# Patient Record
Sex: Male | Born: 1995 | Race: Black or African American | Hispanic: No | Marital: Single | Smoking: Current some day smoker
Health system: Southern US, Community
[De-identification: ages and names within clinical notes are randomized; demographics above are authoritative.]

## PROBLEM LIST (undated history)

## (undated) DIAGNOSIS — R569 Unspecified convulsions: Secondary | ICD-10-CM

---

## 2019-05-12 ENCOUNTER — Encounter (HOSPITAL_COMMUNITY): Payer: Self-pay | Admitting: Emergency Medicine

## 2019-05-12 ENCOUNTER — Emergency Department (HOSPITAL_COMMUNITY)
Admission: EM | Admit: 2019-05-12 | Discharge: 2019-05-12 | Disposition: A | Discharge status: Home / Self Care | Payer: Self-pay | Attending: Emergency Medicine | Admitting: Emergency Medicine

## 2019-05-12 ENCOUNTER — Other Ambulatory Visit: Payer: Self-pay

## 2019-05-12 DIAGNOSIS — F17200 Nicotine dependence, unspecified, uncomplicated: Secondary | ICD-10-CM | POA: Insufficient documentation

## 2019-05-12 DIAGNOSIS — R569 Unspecified convulsions: Secondary | ICD-10-CM | POA: Insufficient documentation

## 2019-05-12 HISTORY — DX: Unspecified convulsions: R56.9

## 2019-05-12 MED ORDER — LEVETIRACETAM 500 MG PO TABS
1000.0000 mg | ORAL_TABLET | Freq: Once | ORAL | Status: AC
  Administered 2019-05-12: 1000 mg via ORAL
  Filled 2019-05-12: qty 2

## 2019-05-12 MED ORDER — LEVETIRACETAM 750 MG PO TABS: 750.0000 mg | ORAL_TABLET | Freq: Two times a day (BID) | ORAL | 0 refills | Status: DC

## 2019-05-12 NOTE — ED Triage Notes (Signed)
Patient is form home where he had a seizure this morning causing him to fall out of his bed to the floor. He was post ictal with EMS but is now alert and oriented. He has a history of seizures and has not been compliant with medications.   EMS vitals: 156/102 BP  92 HR 94% O2 sat on room air 137 CBG

## 2019-05-12 NOTE — ED Provider Notes (Signed)
[facility]-EMERGENCY DEPT Provider Note   CSN: [id number] Arrival date & time: 05/12/19  1142     History   Chief Complaint Chief Complaint  Patient presents with  . Seizures    HPI David Kelley is a 23 y.o. male.     HPI Patient presents after possible seizure. Patient does not recall awakening this morning, until he did so in an ambulance. He states that he was in his usual state of health when he went to sleep last night. He currently denies any pain, weakness, discomfort. Per report the patient had a witnessed seizure at home. He is reportedly also not compliant with his medication. Patient self acknowledges taking his last dose of Keppra about 1 week ago, cannot specify why he has not taken his medication more recently. Per EMS the patient was postictal, classically on their arrival, but improved in route to the hospital.  Past Medical History:  Diagnosis Date  . Seizures (HCC)     There are no active problems to display for this patient.   History reviewed. No pertinent surgical history.      Home Medications    Prior to Admission medications   Not on File    Family History History reviewed. No pertinent family history.  Social History Social History   Tobacco Use  . Smoking status: Current Some Day Smoker  . Smokeless tobacco: Never Used  Substance Use Topics  . Alcohol use: Not on file  . Drug use: Not on file     Allergies   Patient has no known allergies.   Review of Systems Review of Systems  Constitutional:       Per HPI, otherwise negative  HENT:       Per HPI, otherwise negative  Respiratory:       Per HPI, otherwise negative  Cardiovascular:       Per HPI, otherwise negative  Gastrointestinal: Negative for vomiting.  Endocrine:       Negative aside from HPI  Genitourinary:       Neg aside from HPI   Musculoskeletal:       Per HPI, otherwise negative  Skin: Negative.   Neurological: Positive  for seizures. Negative for syncope.     Physical Exam Updated Vital Signs BP (!) 150/98   Pulse 80   Temp 97.6 F (36.4 C) (Oral)   Resp 15   Ht 6\' 4"  (1.93 m)   Wt (!) 147.4 kg   SpO2 96%   BMI 39.56 kg/m   Physical Exam Vitals signs and nursing note reviewed.  Constitutional:      General: He is not in acute distress.    Appearance: He is well-developed. He is obese.     Comments: Obese adult young male sleepy, but oriented appropriately when awake, speaking clearly, denying complaints.  HENT:     Head: Normocephalic and atraumatic.     Comments: Minor trauma, posterior right tongue, no active bleeding Eyes:     Conjunctiva/sclera: Conjunctivae normal.  Neck:     Musculoskeletal: No spinous process tenderness or muscular tenderness.  Cardiovascular:     Rate and Rhythm: Normal rate and regular rhythm.  Pulmonary:     Effort: Pulmonary effort is normal. No respiratory distress.     Breath sounds: No stridor.  Abdominal:     General: There is no distension.  Skin:    General: Skin is warm and dry.  Neurological:     General: No focal deficit present.  Mental Status: He is oriented to person, place, and time.     Motor: Motor function is intact.      ED Treatments / Results   Procedures Procedures (including critical care time)  Medications Ordered in ED Medications  levETIRAcetam (KEPPRA) tablet 1,000 mg (1,000 mg Oral Given 05/12/19 1217)     Initial Impression / Assessment and Plan / ED Course  I have reviewed the triage vital signs and the nursing notes.  Pertinent labs & imaging results that were available during my care of the patient were reviewed by me and considered in my medical decision making (see chart for details).    After arrival the patient was provided Keppra.     1:39 PM Now accompanied by girlfriend, in no distress, much more awake. This obese young male with history of seizures, acknowledged medical noncompliance presents  after a likely seizure. Here he is awake and alert after a postictal phase. He has no evidence for persistent neurologic deficits, no complaints, no evidence for traumatic findings, with his history of seizures, medication noncompliance he was started on Keppra.  Final Clinical Impressions(s) / ED Diagnoses   Seizure   Carmin Muskrat, MD 05/12/19 1339

## 2020-01-04 ENCOUNTER — Encounter (HOSPITAL_COMMUNITY): Payer: Self-pay | Admitting: Emergency Medicine

## 2020-01-04 ENCOUNTER — Emergency Department (HOSPITAL_COMMUNITY): Payer: BC Managed Care – PPO

## 2020-01-04 ENCOUNTER — Observation Stay (HOSPITAL_COMMUNITY)
Admission: EM | Admit: 2020-01-04 | Discharge: 2020-01-05 | Disposition: A | Discharge status: Home / Self Care | Payer: BC Managed Care – PPO | Attending: Family Medicine | Admitting: Family Medicine

## 2020-01-04 ENCOUNTER — Other Ambulatory Visit: Payer: Self-pay

## 2020-01-04 ENCOUNTER — Observation Stay (HOSPITAL_COMMUNITY): Payer: BC Managed Care – PPO

## 2020-01-04 DIAGNOSIS — G40909 Epilepsy, unspecified, not intractable, without status epilepticus: Principal | ICD-10-CM | POA: Insufficient documentation

## 2020-01-04 DIAGNOSIS — M25511 Pain in right shoulder: Secondary | ICD-10-CM | POA: Diagnosis not present

## 2020-01-04 DIAGNOSIS — F17200 Nicotine dependence, unspecified, uncomplicated: Secondary | ICD-10-CM | POA: Insufficient documentation

## 2020-01-04 DIAGNOSIS — I10 Essential (primary) hypertension: Secondary | ICD-10-CM | POA: Diagnosis not present

## 2020-01-04 DIAGNOSIS — G8929 Other chronic pain: Secondary | ICD-10-CM | POA: Diagnosis not present

## 2020-01-04 DIAGNOSIS — Z79899 Other long term (current) drug therapy: Secondary | ICD-10-CM | POA: Insufficient documentation

## 2020-01-04 DIAGNOSIS — E6609 Other obesity due to excess calories: Secondary | ICD-10-CM | POA: Insufficient documentation

## 2020-01-04 DIAGNOSIS — E7849 Other hyperlipidemia: Secondary | ICD-10-CM

## 2020-01-04 DIAGNOSIS — M25519 Pain in unspecified shoulder: Secondary | ICD-10-CM

## 2020-01-04 DIAGNOSIS — R569 Unspecified convulsions: Secondary | ICD-10-CM

## 2020-01-04 DIAGNOSIS — M25512 Pain in left shoulder: Secondary | ICD-10-CM | POA: Diagnosis not present

## 2020-01-04 DIAGNOSIS — Z20822 Contact with and (suspected) exposure to covid-19: Secondary | ICD-10-CM | POA: Diagnosis not present

## 2020-01-04 DIAGNOSIS — Z9119 Patient's noncompliance with other medical treatment and regimen: Secondary | ICD-10-CM

## 2020-01-04 DIAGNOSIS — Z91199 Patient's noncompliance with other medical treatment and regimen due to unspecified reason: Secondary | ICD-10-CM

## 2020-01-04 LAB — RAPID URINE DRUG SCREEN, HOSP PERFORMED
Amphetamines: NOT DETECTED
Barbiturates: NOT DETECTED
Benzodiazepines: NOT DETECTED
Cocaine: NOT DETECTED
Opiates: NOT DETECTED
Tetrahydrocannabinol: POSITIVE — AB

## 2020-01-04 LAB — URINALYSIS, ROUTINE W REFLEX MICROSCOPIC
Bilirubin Urine: NEGATIVE
Glucose, UA: NEGATIVE mg/dL
Hgb urine dipstick: NEGATIVE
Ketones, ur: NEGATIVE mg/dL
Leukocytes,Ua: NEGATIVE
Nitrite: NEGATIVE
Protein, ur: NEGATIVE mg/dL
Specific Gravity, Urine: 1.02 (ref 1.005–1.030)
pH: 5 (ref 5.0–8.0)

## 2020-01-04 LAB — COMPREHENSIVE METABOLIC PANEL
ALT: 23 U/L (ref 0–44)
AST: 19 U/L (ref 15–41)
Albumin: 3.7 g/dL (ref 3.5–5.0)
Alkaline Phosphatase: 62 U/L (ref 38–126)
Anion gap: 12 (ref 5–15)
BUN: 10 mg/dL (ref 6–20)
CO2: 23 mmol/L (ref 22–32)
Calcium: 9.3 mg/dL (ref 8.9–10.3)
Chloride: 106 mmol/L (ref 98–111)
Creatinine, Ser: 0.9 mg/dL (ref 0.61–1.24)
GFR calc Af Amer: >60 mL/min (ref >60)
GFR calc non Af Amer: >60 mL/min (ref >60)
Glucose, Bld: 98 mg/dL (ref 70–99)
Potassium: 4.2 mmol/L (ref 3.5–5.1)
Sodium: 141 mmol/L (ref 135–145)
Total Bilirubin: 0.5 mg/dL (ref 0.3–1.2)
Total Protein: 6.6 g/dL (ref 6.5–8.1)

## 2020-01-04 LAB — CBC WITH DIFFERENTIAL/PLATELET
Abs Immature Granulocytes: 0.08 10*3/uL — ABNORMAL HIGH (ref 0.00–0.07)
Basophils Absolute: 0 10*3/uL (ref 0.0–0.1)
Basophils Relative: 0 %
Eosinophils Absolute: 0.1 10*3/uL (ref 0.0–0.5)
Eosinophils Relative: 1 %
HCT: 43.6 % (ref 39.0–52.0)
Hemoglobin: 13.3 g/dL (ref 13.0–17.0)
Immature Granulocytes: 1 %
Lymphocytes Relative: 13 %
Lymphs Abs: 2.1 10*3/uL (ref 0.7–4.0)
MCH: 27.1 pg (ref 26.0–34.0)
MCHC: 30.5 g/dL (ref 30.0–36.0)
MCV: 89 fL (ref 80.0–100.0)
Monocytes Absolute: 1.4 10*3/uL — ABNORMAL HIGH (ref 0.1–1.0)
Monocytes Relative: 9 %
Neutro Abs: 12.2 10*3/uL — ABNORMAL HIGH (ref 1.7–7.7)
Neutrophils Relative %: 76 %
Platelets: 168 10*3/uL (ref 150–400)
RBC: 4.9 MIL/uL (ref 4.22–5.81)
RDW: 14.8 % (ref 11.5–15.5)
WBC: 15.8 10*3/uL — ABNORMAL HIGH (ref 4.0–10.5)
nRBC: 0 % (ref 0.0–0.2)

## 2020-01-04 LAB — MAGNESIUM: Magnesium: 1.9 mg/dL (ref 1.7–2.4)

## 2020-01-04 LAB — CBG MONITORING, ED: Glucose-Capillary: 107 mg/dL — ABNORMAL HIGH (ref 70–99)

## 2020-01-04 LAB — CK: Total CK: 322 U/L (ref 49–397)

## 2020-01-04 LAB — SARS CORONAVIRUS 2 BY RT PCR (HOSPITAL ORDER, PERFORMED IN ~~LOC~~ HOSPITAL LAB): SARS Coronavirus 2: NEGATIVE

## 2020-01-04 MED ORDER — SODIUM CHLORIDE 0.9 % IV SOLN
2000.0000 mg | Freq: Once | INTRAVENOUS | Status: DC
  Filled 2020-01-04: qty 20

## 2020-01-04 MED ORDER — LORAZEPAM 2 MG/ML IJ SOLN: 2.0000 mg | Freq: Once | INTRAMUSCULAR | Status: DC | PRN

## 2020-01-04 MED ORDER — LEVETIRACETAM IN NACL 1500 MG/100ML IV SOLN
1500.0000 mg | Freq: Once | INTRAVENOUS | Status: DC
  Filled 2020-01-04: qty 100

## 2020-01-04 MED ORDER — ONDANSETRON HCL 4 MG/2ML IJ SOLN
4.0000 mg | Freq: Once | INTRAMUSCULAR | Status: AC
  Administered 2020-01-04: 4 mg via INTRAVENOUS
  Filled 2020-01-04: qty 2

## 2020-01-04 MED ORDER — LORAZEPAM 2 MG/ML IJ SOLN
INTRAMUSCULAR | Status: AC
  Filled 2020-01-04: qty 1

## 2020-01-04 MED ORDER — SODIUM CHLORIDE 0.9 % IV SOLN
3000.0000 mg | Freq: Once | INTRAVENOUS | Status: DC
  Filled 2020-01-04: qty 30

## 2020-01-04 MED ORDER — LEVETIRACETAM IN NACL 1500 MG/100ML IV SOLN
1500.0000 mg | Freq: Two times a day (BID) | INTRAVENOUS | Status: DC
  Administered 2020-01-05: 1500 mg via INTRAVENOUS
  Filled 2020-01-04 (×3): qty 100

## 2020-01-04 MED ORDER — LORAZEPAM 2 MG/ML IJ SOLN
2.0000 mg | Freq: Once | INTRAMUSCULAR | Status: AC
  Administered 2020-01-04: 2 mg via INTRAVENOUS

## 2020-01-04 MED ORDER — ACETAMINOPHEN 650 MG RE SUPP: 650.0000 mg | Freq: Four times a day (QID) | RECTAL | Status: DC | PRN

## 2020-01-04 MED ORDER — LEVETIRACETAM IN NACL 1000 MG/100ML IV SOLN
1000.0000 mg | Freq: Once | INTRAVENOUS | Status: AC
  Administered 2020-01-04: 1000 mg via INTRAVENOUS

## 2020-01-04 MED ORDER — ACETAMINOPHEN 325 MG PO TABS: 650.0000 mg | ORAL_TABLET | Freq: Four times a day (QID) | ORAL | Status: DC | PRN

## 2020-01-04 MED ORDER — LEVETIRACETAM IN NACL 1000 MG/100ML IV SOLN
1000.0000 mg | INTRAVENOUS | Status: AC
  Administered 2020-01-04: 1000 mg via INTRAVENOUS

## 2020-01-04 MED ORDER — ENOXAPARIN SODIUM 40 MG/0.4ML ~~LOC~~ SOLN
40.0000 mg | SUBCUTANEOUS | Status: DC
  Administered 2020-01-05: 40 mg via SUBCUTANEOUS
  Filled 2020-01-04: qty 0.4

## 2020-01-04 NOTE — ED Triage Notes (Addendum)
Pt states his fiance reported he had "9 seizures" consecutively between 11:30am-12pm while sleeping.   PT reports tongue pain. States he is taking his Keppra as prescribed.

## 2020-01-04 NOTE — ED Provider Notes (Signed)
[facility] EMERGENCY DEPARTMENT Provider Note   CSN: [id number] Arrival date & time: 01/04/20  1316     History Chief Complaint  Patient presents with  . Seizures    David Kelley is a 24 y.o. male.  The history is provided by the patient, a significant other and medical records. No language interpreter was used.   David Kelley is a 24 y.o. male who presents to the Emergency Department complaining of seizure.  He presents to the ED for evaluation following seizure at home.  Hx from s.o. he had multiple seizures at home while they were both laying in bed. Seizure started around 1130 this morning. The initial start seizure started with foaming at the mouth in his eyes rolled back in his head. She reports generalized shaking activity. The episode lasted for a few minutes and then there were a few seconds to minutes in between episodes. He never regained consciousness in between episodes. She states there around nine back to back episodes. This lasted for 30 to 45 minutes. He denies any recent illnesses. He does feel tired currently. He takes Keppra 1000 mg BID and is good about taking his medications as prescribed. He did take it last night and today. He does report that he doesn't eat regularly and may only eat one meal a day. Symptoms are severe in nature.         Past Medical History:  Diagnosis Date  . Seizures (HCC)     There are no problems to display for this patient.   History reviewed. No pertinent surgical history.     No family history on file.  Social History   Tobacco Use  . Smoking status: Current Some Day Smoker  . Smokeless tobacco: Never Used  Substance Use Topics  . Alcohol use: Not Currently  . Drug use: Not Currently    Home Medications Prior to Admission medications   Medication Sig Start Date End Date Taking? Authorizing Provider  levETIRAcetam (KEPPRA) 750 MG tablet Take 1 tablet (750 mg total) by mouth 2 (two) times  daily. 05/12/19   Gerhard Munch, MD    Allergies    Patient has no known allergies.  Review of Systems   Review of Systems  All other systems reviewed and are negative.   Physical Exam Updated Vital Signs BP 131/89 (BP Location: Right Arm)   Pulse 94   Temp 98.1 F (36.7 C) (Oral)   Resp 14   SpO2 92%   Physical Exam Vitals and nursing note reviewed.  Constitutional:      Appearance: He is well-developed.  HENT:     Head: Normocephalic and atraumatic.     Comments: Mild right tongue swelling without any laceration Eyes:     Extraocular Movements: Extraocular movements intact.     Pupils: Pupils are equal, round, and reactive to light.  Cardiovascular:     Rate and Rhythm: Normal rate and regular rhythm.  Pulmonary:     Effort: Pulmonary effort is normal. No respiratory distress.  Abdominal:     Palpations: Abdomen is soft.     Tenderness: There is no abdominal tenderness. There is no guarding or rebound.  Musculoskeletal:        General: No tenderness.  Skin:    General: Skin is warm and dry.  Neurological:     Mental Status: He is alert and oriented to person, place, and time.     Comments: No asymmetry of facial movements. Visual fields are grossly  intact. Five out of five strength in all four extremities with sensation to light touch intact in all four extremities  Psychiatric:        Behavior: Behavior normal.     ED Results / Procedures / Treatments   Labs (all labs ordered are listed, but only abnormal results are displayed) Labs Reviewed  URINALYSIS, ROUTINE W REFLEX MICROSCOPIC - Abnormal; Notable for the following components:      Result Value   APPearance CLOUDY (*)    All other components within normal limits  CBC WITH DIFFERENTIAL/PLATELET - Abnormal; Notable for the following components:   WBC 15.8 (*)    Neutro Abs 12.2 (*)    Monocytes Absolute 1.4 (*)    Abs Immature Granulocytes 0.08 (*)    All other components within normal limits    CBG MONITORING, ED - Abnormal; Notable for the following components:   Glucose-Capillary 107 (*)    All other components within normal limits  COMPREHENSIVE METABOLIC PANEL  MAGNESIUM  CK    EKG None  Radiology DG Chest 2 View  Result Date: 01/04/2020 CLINICAL DATA:  Seizures while sleeping. EXAM: CHEST - 2 VIEW COMPARISON:  None. FINDINGS: The heart size and mediastinal contours are within normal limits. The lungs are clear. No pneumothorax or pleural effusion. The visualized skeletal structures are unremarkable. IMPRESSION: No acute cardiopulmonary process. Electronically Signed   By: Audie Pinto M.D.   On: 01/04/2020 15:18    Procedures Procedures (including critical care time)  Medications Ordered in ED Medications - No data to display  ED Course  I have reviewed the triage vital signs and the nursing notes.  Pertinent labs & imaging results that were available during my care of the patient were reviewed by me and considered in my medical decision making (see chart for details).    MDM Rules/Calculators/A&P                     Patient with history of seizure disorder here for evaluation following multiple seizures. He is neurologically intact on evaluation. Dr. Lorraine Lax with Neurology consulted given history of seizures that lasted 30 to 45 minutes. Patient care transferred pending full labs and neurology evaluation.  Final Clinical Impression(s) / ED Diagnoses Final diagnoses:  Seizure [facility])    Rx / [place] Orders ED Discharge Orders    None       Quintella Reichert, MD 01/04/20 1547

## 2020-01-04 NOTE — ED Notes (Signed)
Patient transported to X-ray 

## 2020-01-04 NOTE — Consult Note (Signed)
Requesting Physician: Dr. Madilyn Hook    Chief Complaint: Multiple seizures without return to baseline  History obtained from:  Chart review  HPI:                                                                                                                                       David Kelley is a 24 y.o. male with past medical history of obesity, generalized seizures presents to the emergency department after multiple seizures at home without return to baseline.  History obtained by ED provider is wife is not at bedside.  According to the wife patient was sleeping and around 11:30 AM she witnessed a generalized tonic-clonic seizure with tongue bite lasting for approximately 1 minute.  It appeared that he did not become responsive afterwards and then had multiple seizures approximately 9 or.  30 minutes.  When EMS arrived, patient started to arouse by the time he arrived to the [facility], ED patient was awake and responsive.  He states he took his Keppra this morning.  Takes 1 g twice daily and states he is compliant with the medication.  While in the emergency department, patient had another generalized tonic-clonic seizure lasting approximately 1 minute he received 2 mg of Ativan and neurology was consulted.  He was loaded with 2 g of Keppra.    Past Medical History:  Diagnosis Date   Seizures (HCC)     History reviewed. No pertinent surgical history.  No family history on file. Social History:  reports that he has been smoking. He has never used smokeless tobacco. He reports previous alcohol use. He reports previous drug use.  Allergies: No Known Allergies  Medications:                                                                                                                        I reviewed home medications.   ROS:  Unable to review systems due to  patient's mental status   Examination:                                                                                                      General: Appears well-developed and well-nourished.  Psych: Affect appropriate to situation Eyes: No scleral injection HENT: No OP obstrucion Head: Normocephalic.  Cardiovascular: Normal rate and regular rhythm.  Respiratory: Effort normal and breath sounds normal to anterior ascultation GI: Soft.  No distension. There is no tenderness.  Skin: WDI    Neurological Examination Mental Status: Alert, oriented, thought content appropriate.  Speech fluent without evidence of aphasia. Able to follow 3 step commands without difficulty. Cranial Nerves: II: Visual fields grossly normal,  III,IV, VI: ptosis not present, extra-ocular motions intact bilaterally, pupils equal, round, reactive to light and accommodation V,VII: smile symmetric, facial light touch sensation normal bilaterally VIII: hearing normal bilaterally IX,X: uvula rises symmetrically XI: bilateral shoulder shrug XII: midline tongue extension Motor: Right : Upper extremity   5/5    Left:     Upper extremity   5/5  Lower extremity   5/5     Lower extremity   5/5 Tone and bulk:normal tone throughout; no atrophy noted Sensory: Pinprick and light touch intact throughout, bilaterally Deep Tendon Reflexes: 2+ and symmetric throughout Plantars: Right: downgoing   Left: downgoing Cerebellar: normal finger-to-nose, normal rapid alternating movements and normal heel-to-shin test Gait: normal gait and station     Lab Results: Basic Metabolic Panel: Recent Labs  Lab 01/04/20 1528  NA 141  K 4.2  CL 106  CO2 23  GLUCOSE 98  BUN 10  CREATININE 0.90  CALCIUM 9.3  MG 1.9    CBC: Recent Labs  Lab 01/04/20 1528  WBC 15.8*  NEUTROABS 12.2*  HGB 13.3  HCT 43.6  MCV 89.0  PLT 168    Coagulation Studies: No results for input(s): LABPROT, INR in the last 72  hours.  Imaging: DG Chest 2 View  Result Date: 01/04/2020 CLINICAL DATA:  Seizures while sleeping. EXAM: CHEST - 2 VIEW COMPARISON:  None. FINDINGS: The heart size and mediastinal contours are within normal limits. The lungs are clear. No pneumothorax or pleural effusion. The visualized skeletal structures are unremarkable. IMPRESSION: No acute cardiopulmonary process. Electronically Signed   By: Emmaline Kluver M.D.   On: 01/04/2020 15:18     I have reviewed the above imaging : No imaging obtained   ASSESSMENT AND PLAN   Status epilepticus: Resolved Generalized tonic-clonic seizures  Recommendations Admit for observation Increase Keppra to 1500 mg twice daily  Check UA, UDS, electrolyte and metabolic disturbances Chart review for diagnosis-I do not see EEG and MRI brain or any neurology notes regarding his seizure diagnosis If patient has not had prior neurological evaluation for seizures-he will need MRI brain with and without contrast as well as an EEG. Ativan 2 mg for seizures lasting greater than 1 minute, please notify neurology on-call if patient has further seizures. Seizure precautions  Addendum - Patient not aware if he has seen  a neurologist. Current medication prescription appears to be from [facility] hospitalist. Needs MRI brain w/wo contrast and R EEG.    Sushanth Aroor [facility] Pager Number [phone]

## 2020-01-04 NOTE — H&P (Addendum)
Family Medicine Teaching [facility] Admission History and Physical Service Pager: [phone]  Patient name: David David Kelley Medical record number: [id number] Date of birth: October 24, 1995 Age: 24 y.o. Gender: male  Primary Care Provider: Patient, No Pcp Per Consultants: Neurology Code Status: Full Preferred Emergency Contact: Lupita Leash, partner  Chief Complaint: seizure  Assessment and Plan: David David Kelley is a 24 y.o. male presenting with seizure. PMH is significant for seizure disorder, chronic right shoulder pain, HTN.  Seizure 4 David [name] w/ h/o seizure d/o prescribed Keppra presents today to ED with report of seizure. His fiance reports he had 4 seizures over a 45 minute timeframe this morning in which he 'tensed up' and lost consciousness. Patient's partner reports that he had stomach pain this morning and had nausea with emesis x1 after his seizures started. . No food suspicious for food poisoning, as partner is not having any sx. She reports that patient was compliant with his medication and he told her he last took Keppra last night. He also smokes marijuana and used it once this morning prior to seizure. No known sick contacts. When patient arrived at ED, he appeared at his neurologic baseline. Subsequently patient had a witnessed generalized tonic-clonic seizure while in the ED and received 2mg  IV ativan and was loaded with 2g keppra. During this seizure, patient desatted to 86% and was placed on 3L , now satting >90% on RA. Patient's home dose of keppra is 1000 mg BID. Patient was hospitalized in February of this at [facility] year for seizures. He has 5 ED visits documented in epic since 2020 for seizures, with most of these visits documenting noncompliance of his keppra. He has had CXR without abnormalities. Labs reveal completely normal CMP, CK WNL at 322, WBCs mildly elevated to 15.8 and ANC elevated to 12.2 c/w demargination s/p seizure. UA WNL. On exam, patient is very somnolent,  but will respond to commands if stimulated. Somnolent state likely due to both post-ictal state and 2mg  IV ativan. Neurology consulted in the ED, appreciate their recommendations. Current ddx is likely underdosing of AED 2/2 body habitus, possibly seizure threshold lowered d/t gastroenteritis. pharmacy tech reports that patient's Rx bottle was last filled in February for 30 day supply, so likely noncompliance is also at play. In care everywhere, patient last had an EEG in 07/2018 that did not reveal epileptiform activity at that time. Last CT head in 10/03/19 that was unremarkable at that time. Does not appear that patient has had MRI, will obtain one. - Admit to med-surg, FPTS, attending Dr. 08/2018 - Continuous cardiac and pulse oximetry - O2 therapy to maintain SpO2 >90% - Neurology consulted, appreciate recommendations - NPO pending bedside swallow, afterwards HH diet - If more seizure events, can use 2mg  ativan IV for cessation - F/U MRI - UDS - Increase Keppra to 1500 mg BID  - Lovenox for DVT ppx - Seizure precautions - PT/OT eval and treat - AM BMP, CBC  HTN  BP in the ED has ranged from SBP 129-157, DBP 89-94. Most recently 157/93. Patient takes 20 mg lisinopril daily. -Continue home medication when no longer NPO  Chronic R shoulder pain Patient reports his shoulder hurts after seizures. He denies sports injuries. At last admission in February, patient was supposed to follow up with orthopedics. -Tylenol PRN for pain when no longer NPO  FEN/GI: Regular diet Prophylaxis: lovenox  Disposition: to med-surg pending medical work up  History of Present Illness:  David David Kelley is a 24 y.o. male presenting  with seizures. Patient was witnessed to have seizures this morning by his partner, Lynett Grimes. She described them as 4 episodes over a 45 minute timeframe in which his body tensed up, he lost consciousness, and was foaming at the mouth.  She states his mouth was bloody from biting  his tongue. she also reports that he was quite sleepy appearing this morning, and he also had nausea, vomiting, and stomach pain.  Patient and his partner ate [employer] this morning, but partner is not sick so unlikely to be food poisoning.  He also had marijuana this AM, but less so than he usually does, "only 1 or 2 hits."  His partner is unaware of any sick contacts.  She denies any recent fevers, chills, coughs. He does not smoke cigarettes or use other recreational drugs. He last took his Keppra yesterday evening, and partner reports that he is compliant with his medications. He was diagnosed with seizure disorder at the end of 2020 and has had 1 admission this year to [facility] for seizure in February. He is relocating from [place], currently lives in [place], [place], and will live in [place]. He works for [employer].  Review Of Systems: Per HPI with the following additions:   Review of Systems  Constitutional: Positive for activity change. Negative for chills, diaphoresis and fever.  HENT: Negative for postnasal drip, rhinorrhea and sneezing.   Respiratory: Negative for cough, shortness of breath and wheezing.   Cardiovascular: Negative for leg swelling.  Gastrointestinal: Positive for nausea and vomiting. Negative for constipation and diarrhea.  Neurological: Positive for seizures.    There are no problems to display for this patient.  Past Medical History: Past Medical History:  Diagnosis Date  . Seizures ([place])     Past Surgical History: History reviewed. No pertinent surgical history.  Social History: Social History   Tobacco Use  . Smoking status: Current Some Day Smoker  . Smokeless tobacco: Never Used  Substance Use Topics  . Alcohol use: Not Currently  . Drug use: Not Currently   Additional social history: see above Please also refer to relevant sections of EMR.  Family History: No family history on file. Unable to answer d/t post-ictal state  Allergies and Medications: No Known  Allergies No current facility-administered medications on file prior to encounter.   Current Outpatient Medications on File Prior to Encounter  Medication Sig Dispense Refill  . levETIRAcetam (KEPPRA) 750 MG tablet Take 1 tablet (750 mg total) by mouth 2 (two) times daily. 60 tablet 0    Objective: BP (!) 157/93 (BP Location: Right Arm)   Pulse (!) 109   Temp 98.1 F (36.7 C) (Oral)   Resp (!) 22   SpO2 95%  Exam: General: obese, tall young AA man in somnolent state, resting on right side, NAD Eyes: anicteric sclerae, PERRLA ENTM: NCAT, MMM, blood on tongue Neck: supple Cardiovascular: RRR, distant heart sounds 2/2 habitus, no m/r/g Respiratory: CTAB, no increased WOB, stertorous upper airway sounds referred throughout lung fields Gastrointestinal: soft, ND, unable to assess tenderness d/t somnolent state MSK: full ROM Derm: no lesions or rashes Neuro: Cranial Nerves: II: PERRL.  III - XII: unable to assess 2/2 somnolent state Motor: Tone is normal. Bulk is normal. 5/5 strength present in b/t UE, LLE, unable to assess RLE due to somnolence Deep Tendon Reflexes: absent and symmetric in the biceps and patellae.  Plantars: Toes are downgoing bilaterally.  Psych: somnolent  Labs and Imaging: CBC BMET  Recent Labs  Lab 01/04/20  1528  WBC 15.8*  HGB 13.3  HCT 43.6  PLT 168   Recent Labs  Lab 01/04/20 1528  NA 141  K 4.2  CL 106  CO2 23  BUN 10  CREATININE 0.90  GLUCOSE 98  CALCIUM 9.3     EKG: pending  DG Chest 2 View  Result Date: 01/04/2020 CLINICAL DATA:  Seizures while sleeping. EXAM: CHEST - 2 VIEW COMPARISON:  None. FINDINGS: The heart size and mediastinal contours are within normal limits. The lungs are clear. No pneumothorax or pleural effusion. The visualized skeletal structures are unremarkable. IMPRESSION: No acute cardiopulmonary process. Electronically Signed   By: Emmaline Kluver M.D.   On: 01/04/2020 15:18   Shirlean Mylar, MD 01/04/2020, 5:07  PM PGY-1, [facility] Family Medicine FPTS Intern pager: [phone], text pages welcome

## 2020-01-04 NOTE — ED Notes (Signed)
Repositioned pulse ox to get good read. Pt stats WDl

## 2020-01-04 NOTE — ED Notes (Signed)
Pt will not stay awake enough to answer questions or follow commands. It is unclear if the Pt is unable to not follow comma and and answer questions or does not want to do so. fiance is at bedside and she states that he has been in and out and this is not his baseline.  fiance also stated that the previous nurse and two others had a hard time being able to complete neuro checks

## 2020-01-04 NOTE — ED Notes (Signed)
Back from MRI.

## 2020-01-04 NOTE — ED Provider Notes (Signed)
Medical screening examination/treatment/procedure(s) were conducted as a shared visit with non-physician practitioner(s) and myself.  I personally evaluated the patient during the encounter.    Pt initially seen by Dr Madilyn Hook.   4:02 PM Pt waiting for neur consult.  Pt has had another seizure.  Lasting approx 3 minutes.  Pt remains postictal.  Ativan and keppra ordered.  Keppra level ordered.  Anticipate admission for recurrent seizures.  Pt seen by Dr Laurence Slate.  Recommends admit for further evaluation.   Linwood Dibbles, MD 01/04/20 [phone]

## 2020-01-04 NOTE — ED Notes (Signed)
Pt is answer questions better ans sedems to b e much more responsive. Pt will squeeze my hands as well as dosi flexion but is not intrested in doing more than he has to . However, Pt seems to be improved since the beginning of my shift

## 2020-01-04 NOTE — ED Notes (Signed)
Off floor to MRI

## 2020-01-04 NOTE — ED Notes (Signed)
Patient having seizure activity at this time. Secretary noted from hallway that patient was leaning on the side of the bed having garbled speech. Upon entering the room, this nurse noted that patient was having a seizure with eyes rolled back and biting his tongue. Dr.Rees and Dr. Lynelle Doctor to patients bedside. Patients seizure lasted ~2 minutes.     Patient confused and lethargic post-ictal, placed on 5L-6L.

## 2020-01-05 ENCOUNTER — Other Ambulatory Visit: Payer: Self-pay | Admitting: Family Medicine

## 2020-01-05 ENCOUNTER — Observation Stay (HOSPITAL_COMMUNITY): Payer: BC Managed Care – PPO

## 2020-01-05 DIAGNOSIS — Z9119 Patient's noncompliance with other medical treatment and regimen: Secondary | ICD-10-CM

## 2020-01-05 DIAGNOSIS — E7849 Other hyperlipidemia: Secondary | ICD-10-CM | POA: Diagnosis not present

## 2020-01-05 DIAGNOSIS — M25512 Pain in left shoulder: Secondary | ICD-10-CM

## 2020-01-05 DIAGNOSIS — M25519 Pain in unspecified shoulder: Secondary | ICD-10-CM

## 2020-01-05 DIAGNOSIS — G8929 Other chronic pain: Secondary | ICD-10-CM

## 2020-01-05 DIAGNOSIS — M25511 Pain in right shoulder: Secondary | ICD-10-CM

## 2020-01-05 DIAGNOSIS — R569 Unspecified convulsions: Secondary | ICD-10-CM | POA: Diagnosis not present

## 2020-01-05 DIAGNOSIS — Z91199 Patient's noncompliance with other medical treatment and regimen due to unspecified reason: Secondary | ICD-10-CM

## 2020-01-05 LAB — CBC
HCT: 39.1 % (ref 39.0–52.0)
Hemoglobin: 12.5 g/dL — ABNORMAL LOW (ref 13.0–17.0)
MCH: 27.2 pg (ref 26.0–34.0)
MCHC: 32 g/dL (ref 30.0–36.0)
MCV: 85 fL (ref 80.0–100.0)
Platelets: 157 10*3/uL (ref 150–400)
RBC: 4.6 MIL/uL (ref 4.22–5.81)
RDW: 14.9 % (ref 11.5–15.5)
WBC: 13.4 10*3/uL — ABNORMAL HIGH (ref 4.0–10.5)
nRBC: 0 % (ref 0.0–0.2)

## 2020-01-05 LAB — BASIC METABOLIC PANEL
Anion gap: 8 (ref 5–15)
BUN: 8 mg/dL (ref 6–20)
CO2: 27 mmol/L (ref 22–32)
Calcium: 9 mg/dL (ref 8.9–10.3)
Chloride: 105 mmol/L (ref 98–111)
Creatinine, Ser: 1.02 mg/dL (ref 0.61–1.24)
GFR calc Af Amer: >60 mL/min (ref >60)
GFR calc non Af Amer: >60 mL/min (ref >60)
Glucose, Bld: 97 mg/dL (ref 70–99)
Potassium: 3.5 mmol/L (ref 3.5–5.1)
Sodium: 140 mmol/L (ref 135–145)

## 2020-01-05 LAB — HIV ANTIBODY (ROUTINE TESTING W REFLEX): HIV Screen 4th Generation wRfx: NONREACTIVE

## 2020-01-05 MED ORDER — ACETAMINOPHEN 325 MG PO TABS: 650.0000 mg | ORAL_TABLET | Freq: Four times a day (QID) | ORAL | Status: AC | PRN

## 2020-01-05 MED ORDER — IBUPROFEN 200 MG PO TABS
400.0000 mg | ORAL_TABLET | Freq: Four times a day (QID) | ORAL | Status: DC | PRN
  Administered 2020-01-05: 400 mg via ORAL
  Filled 2020-01-05: qty 2

## 2020-01-05 MED ORDER — LEVETIRACETAM 750 MG PO TABS: 1500.0000 mg | ORAL_TABLET | Freq: Two times a day (BID) | ORAL | 0 refills | Status: DC

## 2020-01-05 MED ORDER — IBUPROFEN 400 MG PO TABS: 400.0000 mg | ORAL_TABLET | Freq: Four times a day (QID) | ORAL | 0 refills | Status: AC | PRN

## 2020-01-05 NOTE — Plan of Care (Signed)
Patient stable, discussed POC with patient and partner, agreeable with plan, denies question/concerns at this time.

## 2020-01-05 NOTE — Progress Notes (Signed)
FPTS Interim Progress Note  S:Per RN, patient passed bedside swallow study this evening.  Patient sleeping at this time.   O: BP (!) 130/95 (BP Location: Right Arm)   Pulse 83   Temp 98.9 F (37.2 C) (Oral)   Resp 18   SpO2 99%    Exam deferred as patient sleeping.   A/P: Seizure  MR Brain without acute abnormalities though motion degraded. Consider MR Bain with contrast, per neurology recommendations.  Await EEG and Keppra level.  See H&P for full plan.   Katha Cabal, DO 01/05/2020, 3:35 AM PGY-1, [facility] Family Medicine Service pager [phone]

## 2020-01-05 NOTE — Discharge Summary (Signed)
Family Medicine Teaching [facility] Discharge Summary  Patient name: David Kelley Medical record number: [id number] Date of birth: 1995/09/21 Age: 24 y.o. Gender: male Date of Admission: 01/04/2020  Date of Discharge: 01/05/20 Admitting Physician: Shirlean Mylar, MD  Primary Care Provider: Shirlean Mylar, MD Consultants: Neurology  Indication for Hospitalization: seizures  Discharge Diagnoses/Problem List:  Seizure disorder HTN Bilateral chronic shoulder pain  Disposition: to home  Discharge Condition: stable and improved  Discharge Exam:  Vitals:   01/05/20 0746 01/05/20 1200  BP: 137/75 125/66  Pulse: 72 78  Resp: 18 18  Temp: 98.3 F (36.8 C) 99.1 F (37.3 C)  SpO2: 94% 98%  Physical Exam: General: young, obese AA man, resting comfortably in bed, NAD Cardiovascular: RRR, no m/r/g Respiratory: CTAB, no IWOB, no w/r/r Abdomen: soft, NT, ND, + BS Extremities: warm, dry, no edema; L shoulder pain, very tender to palpation Neuro: No focal neurodeficits, strength 5/5 in all extremities, reflexes minimal and symmetric  Brief Hospital Course:  Seizure Patient presented to ED on 01/04/20 after experiencing seizures at home in the morning, he then had a witnessed seizure while in the ED. Patient has a known seizure disorder. He is likely having repeated seizures due to medication noncompliance, as patient's keppra prescription was last filled for 30 days in February and only had ~25% of bottle used. Patient has had 5 presentations to the ED for seizures in the last year, likely due to noncompliance. Also patient is likely underdosed. MR brain was incomplete and motion degraded, however ischemia, hemorrhage, and mass ruled out. EEG was WNL, no epileptiform activity. Patient had returned to his neurologic baseline by 01/05/20 and keppra dose was increased to 1500mg  BID. Patient to follow up with [facility] in 3-4 weeks.  HTN Patient was notably  hypertensive this admission. Previously patient had taken lisinopril, but he wasn't taking this medication at admission. Recommend patient restart lisinopril and establish with PCP to ensure normal electrolytes. Patient may establish with [facility] if he so chooses.  Chronic b/l shoulder pain Patient has had shoulder pain many times on both shoulders, however his L shoulder was exquisitely tender to palpation on exam. L shoulder x-ray showed, "posttraumatic deformity, osteochondroma or other osseous lesion." Radiology recommends CT follow up, can be performed in outpatient setting.  Issues for Follow Up:  1. Seizure disorder: Keppra increased to 1500 mg BID. Patient to follow up with neurology ([facility] in 4 weeks) 2. HTN: continue taking lisinopril, will need BMP in 1 week 3. L shoulder pain: xray revealed an old injury, an osteochondroma, or other osseous lesion. No h/o trauma. Recommend repeat CT and referral to orthopedics.  Significant Procedures: none  Significant Labs and Imaging:  Recent Labs  Lab 01/04/20 1528 01/05/20 0514  WBC 15.8* 13.4*  HGB 13.3 12.5*  HCT 43.6 39.1  PLT 168 157   Recent Labs  Lab 01/04/20 1528 01/05/20 0514  NA 141 140  K 4.2 3.5  CL 106 105  CO2 23 27  GLUCOSE 98 97  BUN 10 8  CREATININE 0.90 1.02  CALCIUM 9.3 9.0  MG 1.9  --   ALKPHOS 62  --   AST 19  --   ALT 23  --   ALBUMIN 3.7  --    EXAM: 01/04/20 CHEST - 2 VIEW FINDINGS: The heart size and mediastinal contours are within normal limits. The lungs are clear. No pneumothorax or pleural effusion. The visualized skeletal structures are unremarkable. IMPRESSION: No acute cardiopulmonary process.  EXAM: 01/04/20 MRI HEAD WITHOUT CONTRAST TECHNIQUE: Multiplanar, multiecho pulse sequences of the brain and surrounding structures were obtained without intravenous contrast. FINDINGS: Brain: Motion degraded study. Patient not able to complete the study Diffusion-weighted imaging is diagnostic and  negative for acute infarct. Ventricle size is normal. No evidence of ischemia, hemorrhage, mass. IMPRESSION: No acute intracranial abnormality Motion degraded study which is incomplete.  DG X-ray L shoulder 01/05/20 Protuberant calcific density lesion at the posterior margin of the scapula near the posteroinferior glenoid, indeterminate, differential includes posttraumatic deformity, osteochondroma or other osseous lesion. Suggest further evaluation with left shoulder CT at this time.  Results/Tests Pending at Time of Discharge: none   Discharge Medications:  Allergies as of 01/05/2020   No Known Allergies     Medication List    TAKE these medications   acetaminophen 325 MG tablet Commonly known as: TYLENOL Take 2 tablets (650 mg total) by mouth every 6 (six) hours as needed for mild pain (or Fever >/= 101).   ibuprofen 400 MG tablet Commonly known as: ADVIL Take 1 tablet (400 mg total) by mouth every 6 (six) hours as needed for mild pain or moderate pain (tongue pain).   levETIRAcetam 750 MG tablet Commonly known as: Keppra Take 2 tablets (1,500 mg total) by mouth 2 (two) times daily. What changed:   how much to take  Another medication with the same name was removed. Continue taking this medication, and follow the directions you see here.   lisinopril 20 MG tablet Commonly known as: ZESTRIL Take 20 mg by mouth daily.       Discharge Instructions: Please refer to Patient Instructions section of EMR for full details.  Patient was counseled important signs and symptoms that should prompt return to medical care, changes in medications, dietary instructions, activity restrictions, and follow up appointments.   Follow-Up Appointments: Follow-up Information    [facility]. Schedule an appointment as soon as possible for a visit.   Specialty: Neurology Why: Make appt for follow up in 3-4 weeks Contact information: [address] [place] Follow up.   Specialty: Family Medicine Why: Can make appt for primary care Contact information: [address] [id number] [place]          Gladys Damme, MD 01/05/2020, 4:44 PM PGY-1, [place]

## 2020-01-05 NOTE — Discharge Instructions (Signed)
While in the hospital you were treated for seizures. To prevent seizures, please take KEPPRA 1500mg  twice daily - Follow up with the neurologist at [facility] in 3-4 weeks (see below for contact)  High blood pressure: please take LISINOPRIL 20mg  daily - you can follow up with the family medicine clinic (see below for contact) to become a new patient and get refills if you so wish  Left shoulder pain There is a bony change on your left shoulder that requires further imaging for diagnosis. This can be done as an outpatient. I recommend you obtain primary care provider who can order this for you.   Seizure, Adult A seizure is a sudden burst of abnormal electrical activity in the brain. Seizures usually last from 30 seconds to 2 minutes. They can cause many different symptoms. Usually, seizures are not harmful unless they last a long time. What are the causes? Common causes of this condition include:  Fever or infection.  Conditions that affect the brain, such as: ? A brain abnormality that you were born with. ? A brain or head injury. ? Bleeding in the brain. ? A tumor. ? Stroke. ? Brain disorders such as autism or cerebral palsy.  Low blood sugar.  Conditions that are passed from parent to child (are inherited).  Problems with substances, such as: ? Having a reaction to a drug or a medicine. ? Suddenly stopping the use of a substance (withdrawal). In some cases, the cause may not be known. A person who has repeated seizures over time without a clear cause has a condition called epilepsy. What increases the risk? You are more likely to get this condition if you have:  A family history of epilepsy.  Had a seizure in the past.  A brain disorder.  A history of head injury, lack of oxygen at birth, or strokes. What are the signs or symptoms? There are many types of seizures. The symptoms vary depending on the type of seizure you have. Examples of symptoms  during a seizure include:  Shaking (convulsions).  Stiffness in the body.  Passing out (losing consciousness).  Head nodding.  Staring.  Not responding to sound or touch.  Loss of bladder control and bowel control. Some people have symptoms right before and right after a seizure happens. Symptoms before a seizure may include:  Fear.  Worry (anxiety).  Feeling like you may vomit (nauseous).  Feeling like the room is spinning (vertigo).  Feeling like you saw or heard something before (dj vu).  Odd tastes or smells.  Changes in how you see. You may see flashing lights or spots. Symptoms after a seizure happens can include:  Confusion.  Sleepiness.  Headache.  Weakness on one side of the body. How is this treated? Most seizures will stop on their own in under 5 minutes. In these cases, no treatment is needed. Seizures that last longer than 5 minutes will usually need treatment. Treatment can include:  Medicines given through an IV tube.  Avoiding things that are known to cause your seizures. These can include medicines that you take for another condition.  Medicines to treat epilepsy.  Surgery to stop the seizures. This may be needed if medicines do not help. Follow these instructions at home: Medicines  Take over-the-counter and prescription medicines only as told by your doctor.  Do not eat or drink anything that may keep your medicine from working, such as alcohol. Activity  Do not do any activities that would be dangerous  if you had another seizure, like driving or swimming. Wait until your doctor says it is safe for you to do them.  If you live in the [place] ask your local DMV (department of motor vehicles) when you can drive.  Get plenty of rest. Teaching others Teach friends and family what to do when you have a seizure. They should:  Lay you on the ground.  Protect your head and body.  Loosen any tight clothing around your neck.  Turn you  on your side.  Not hold you down.  Not put anything into your mouth.  Know whether or not you need emergency care.  Stay with you until you are better.  General instructions  Contact your doctor each time you have a seizure.  Avoid anything that gives you seizures.  Keep a seizure diary. Write down: ? What you think caused each seizure. ? What you remember about each seizure.  Keep all follow-up visits as told by your doctor. This is important. Contact a doctor if:  You have another seizure.  You have seizures more often.  There is any change in what happens during your seizures.  You keep having seizures with treatment.  You have symptoms of being sick or having an infection. Get help right away if:  You have a seizure that: ? Lasts longer than 5 minutes. ? Is different than seizures you had before. ? Makes it harder to breathe. ? Happens after you hurt your head.  You have any of these symptoms after a seizure: ? Not being able to speak. ? Not being able to use a part of your body. ? Confusion. ? A bad headache.  You have two or more seizures in a row.  You do not wake up right after a seizure.  You get hurt during a seizure. These symptoms may be an emergency. Do not wait to see if the symptoms will go away. Get medical help right away. Call your local emergency services (911 in the [place] Do not drive yourself to the hospital. Summary  Seizures usually last from 30 seconds to 2 minutes. Usually, they are not harmful unless they last a long time.  Do not eat or drink anything that may keep your medicine from working, such as alcohol.  Teach friends and family what to do when you have a seizure.  Contact your doctor each time you have a seizure. This information is not intended to replace advice given to you by your health care provider. Make sure you discuss any questions you have with your health care provider. Document Revised: 10/11/2018 Document  Reviewed: 10/11/2018 Elsevier Patient Education  [place].

## 2020-01-05 NOTE — ED Notes (Signed)
Pt continues to improve. This is the first time he has followed commands and com,pleted the modified NIH. Although lethargic the pt is aware of what is going on.

## 2020-01-05 NOTE — Evaluation (Signed)
Physical Therapy Evaluation Patient Details Name: David Kelley MRN: [id number] DOB: August 23, 1995 Today's Date: 01/05/2020   History of Present Illness  Pt is 24 yo male with PMH significant for seizure disorder, HTN, and chronic R shoulder pain.  Pt admitted on 01/04/20 for seizures.  Pt noted to be non compliant with Keppra prescription.  Pt c/o L shoulder pain since seizure - xray negative for fracture.  Clinical Impression  Pt admitted with seizures and reports that he feels everything is back to normal other than shoulder soreness.  Pt demonstrated excellent balance on dynamic test and DGI.  He was able to perform stairs and ambulate without LOB.  Pt safe to d/c home from a mobility perspective.  If he continues to have shoulder pain, may benefit from outpt PT workup.     Follow Up Recommendations No PT follow up    Equipment Recommendations  None recommended by PT    Recommendations for Other Services       Precautions / Restrictions Precautions Precautions: Other (comment) Precaution Comments: seizure      Mobility  Bed Mobility Overal bed mobility: Independent                Transfers Overall transfer level: Independent                  Ambulation/Gait Ambulation/Gait assistance: Supervision Gait Distance (Feet): 400 Feet Assistive device: None Gait Pattern/deviations: Step-through pattern;WFL(Within Functional Limits) Gait velocity: normal   General Gait Details: Needing cues for safety due to IV, but otherwise normal gait  Stairs Stairs: Yes Stairs assistance: Supervision Stair Management: No rails;Alternating pattern;Forwards Number of Stairs: 5    Wheelchair Mobility    Modified Rankin (Stroke Patients Only)       Balance Overall balance assessment: Independent                               Standardized Balance Assessment Standardized Balance Assessment : Dynamic Gait Index   Dynamic Gait Index Level Surface:  Normal Change in Gait Speed: Normal Gait with Horizontal Head Turns: Normal Gait with Vertical Head Turns: Normal Gait and Pivot Turn: Normal Step Over Obstacle: Normal Step Around Obstacles: Normal Steps: Normal Total Score: 24       Pertinent Vitals/Pain Pain Assessment: Faces Faces Pain Scale: Hurts a little bit Pain Location: bil shoulder - sore with movement, reports usually happends after seizures Pain Descriptors / Indicators: Sore Pain Intervention(s): Limited activity within patient's tolerance    Home Living Family/patient expects to be discharged to:: Private residence Living Arrangements: Spouse/significant other Available Help at Discharge: Family;Available PRN/intermittently Type of Home: House Home Access: Stairs to enter Entrance Stairs-Rails: Right;Left;Can reach both Entrance Stairs-Number of Steps: 10 Home Layout: One level Home Equipment: None      Prior Function Level of Independence: Independent               Hand Dominance        Extremity/Trunk Assessment   Upper Extremity Assessment Upper Extremity Assessment: Overall WFL for tasks assessed(Pt with normal ROM , strength, sensation , and coordination. Did have pain with shoulder elevation)    Lower Extremity Assessment Lower Extremity Assessment: Overall WFL for tasks assessed(Pt with normal ROM , strength, sensation , and coordination.)    Cervical / Trunk Assessment Cervical / Trunk Assessment: Normal  Communication   Communication: No difficulties  Cognition Arousal/Alertness: Awake/alert Behavior During Therapy: Flat affect Overall Cognitive  Status: Within Functional Limits for tasks assessed                                 General Comments: significant other present      General Comments General comments (skin integrity, edema, etc.): Pt able to stand with feet together EO and EC , reach outside BOS, and turn without LOB    Exercises     Assessment/Plan     PT Assessment Patent does not need any further PT services  PT Problem List         PT Treatment Interventions      PT Goals (Current goals can be found in the Care Plan section)  Acute Rehab PT Goals Patient Stated Goal: return home PT Goal Formulation: All assessment and education complete, DC therapy Time For Goal Achievement: 01/05/20 Potential to Achieve Goals: Good    Frequency     Barriers to discharge        Co-evaluation               AM-PAC PT "6 Clicks" Mobility  Outcome Measure Help needed turning from your back to your side while in a flat bed without using bedrails?: None Help needed moving from lying on your back to sitting on the side of a flat bed without using bedrails?: None Help needed moving to and from a bed to a chair (including a wheelchair)?: None Help needed standing up from a chair using your arms (e.g., wheelchair or bedside chair)?: None Help needed to walk in hospital room?: None Help needed climbing 3-5 steps with a railing? : None 6 Click Score: 24    End of Session   Activity Tolerance: Patient tolerated treatment well Patient left: in bed;with call bell/phone within reach;with family/visitor present Nurse Communication: Mobility status      Time: 1420-1440 PT Time Calculation (min) (ACUTE ONLY): 20 min   Charges:   PT Evaluation $PT Eval Low Complexity: 1 Low          Royetta Asal, PT Acute Rehab Services Pager [phone] [facility] [phone] [facility] [phone]   Rayetta Humphrey 01/05/2020, 2:50 PM

## 2020-01-05 NOTE — Progress Notes (Addendum)
Family Medicine Teaching Service Daily Progress Note Intern Pager: [phone]  Patient name: David Kelley Medical record number: [id number] Date of birth: 1996-07-24 Age: 24 y.o. Gender: male  Primary Care Provider: Patient, No Pcp Per Consultants: Neurology Code Status: Full  Pt Overview and Major Events to Date:  01/04/20 admitted for seizures  Assessment and Plan: David Kelley is a 24 y.o. male presenting with seizure. PMH is significant for seizure disorder, chronic right shoulder pain, HTN.  Seizure Patient presented yesterday to ED after experiencing seizures at home in the morning, he then had a witnessed seizure while in the ED. Patient has a known seizure disorder. He is likely having repeated seizures due to medication noncompliance, as patient's keppra prescription was last filled for 30 days in February and only had ~25% of bottle used. Also patient is likely underdosed. Will obtain accurate weight today. MRI last night was incomplete and motion degraded, however ischemia, hemorrhage, and mass ruled out. Awaiting EEG today. AM labs WNL, WBC decreasing to 13.4, c/w seizure. UDS only (+) for THC, as per history. He reports tongue pain today. Possible d/c later today if neurology signs off. - Continuous cardiac and pulse oximetry - O2 therapy to maintain SpO2 >90% - Neurology consulted, appreciate recommendations - HH diet - If more seizure events, can use 2mg  ativan IV for cessation - Increase Keppra to 1500 mg BID  - NSAID for tongue pain - Lovenox for DVT ppx - Seizure precautions - PT/OT eval and treat  HTN  BP ON ranged from 129-161, 77-95. Most recently 138/94. Patient did not have noticeable dip during sleeping as would be expected. - Restart home med lisinopril 20 mg  Chronic bilateral shoulder pain Patient reports his shoulders hurts after seizures. Today he has L shoulder pain and is exquisitely tender to palpation.  - L shoulder x-ray to r/o  fx -Tylenol PRN for pain prn  FEN/GI: Regular diet Prophylaxis: lovenox  Disposition: pending further medical work up  Subjective:  NAEO. Patient awake and alert, reports he is at his neurologic baseline. Reports tongue and L shoulder pain.  Objective: Temp:  [98.1 F (36.7 C)-99.3 F (37.4 C)] 99.3 F (37.4 C) (05/31 0536) Pulse Rate:  [67-116] 67 (05/31 0536) Resp:  [14-28] 19 (05/31 0536) BP: (129-161)/(77-95) 138/94 (05/31 0536) SpO2:  [86 %-99 %] 96 % (05/31 0536) Physical Exam: General: young, obese AA man, resting comfortably in bed, NAD Cardiovascular: RRR, no m/r/g Respiratory: CTAB, no IWOB, no w/r/r Abdomen: soft, NT, ND, + BS Extremities: warm, dry, no edema; L shoulder pain, very tender to palpation Neuro: No focal neurodeficits, strength 5/5 in all extremities, reflexes minimal and symmetric  Laboratory: Recent Labs  Lab 01/04/20 1528 01/05/20 0514  WBC 15.8* 13.4*  HGB 13.3 12.5*  HCT 43.6 39.1  PLT 168 157   Recent Labs  Lab 01/04/20 1528 01/05/20 0514  NA 141 140  K 4.2 3.5  CL 106 105  CO2 23 27  BUN 10 8  CREATININE 0.90 1.02  CALCIUM 9.3 9.0  PROT 6.6  --   BILITOT 0.5  --   ALKPHOS 62  --   ALT 23  --   AST 19  --   GLUCOSE 98 97   Imaging/Diagnostic Tests: DG Chest 2 View  Result Date: 01/04/2020 CLINICAL DATA:  Seizures while sleeping. EXAM: CHEST - 2 VIEW COMPARISON:  None. FINDINGS: The heart size and mediastinal contours are within normal limits. The lungs are clear. No pneumothorax or pleural effusion.  The visualized skeletal structures are unremarkable. IMPRESSION: No acute cardiopulmonary process. Electronically Signed   By: Audie Pinto M.D.   On: 01/04/2020 15:18   MR BRAIN WO CONTRAST  Result Date: 01/04/2020 CLINICAL DATA:  Encephalopathy EXAM: MRI HEAD WITHOUT CONTRAST TECHNIQUE: Multiplanar, multiecho pulse sequences of the brain and surrounding structures were obtained without intravenous contrast. COMPARISON:   None. FINDINGS: Brain: Motion degraded study. Patient not able to complete the study Diffusion-weighted imaging is diagnostic and negative for acute infarct. Ventricle size is normal. No evidence of ischemia, hemorrhage, mass. IMPRESSION: No acute intracranial abnormality Motion degraded study which is incomplete. Electronically Signed   By: Franchot Gallo M.D.   On: 01/04/2020 21:21   Gladys Damme, MD 01/05/2020, 6:40 AM PGY-1, [place] Intern pager: [phone], text pages welcome

## 2020-01-05 NOTE — Procedures (Signed)
Patient Name: David Kelley  MRN: [id number]  Epilepsy Attending: Charlsie Quest  Referring Physician/Provider: Dr. Georgiana Spinner Aroor Date: 01/05/2020 Duration: 29.41 minutes  Patient history: 24 year old male with history of epilepsy presented after multiple seizures at home without return to baseline.  EEG evaluate for seizures.  Level of alertness: Awake, asleep  AEDs during EEG study: Keppra  Technical aspects: This EEG study was done with scalp electrodes positioned according to the 10-20 International system of electrode placement. Electrical activity was acquired at a sampling rate of 500Hz  and reviewed with a high frequency filter of 70Hz  and a low frequency filter of 1Hz . EEG data were recorded continuously and digitally stored.   Description: The posterior dominant rhythm consists of 9.5 Hz activity of moderate voltage (25-35 uV) seen predominantly in posterior head regions, symmetric and reactive to eye opening and eye closing. Sleep was characterized by vertex waves, sleep spindles (12 to 14 Hz), maximal frontocentral region. Hyperventilation did not show any EEG change.    IMPRESSION: This study is within normal limits. No seizures or epileptiform discharges were seen throughout the recording.  Priyanka 

## 2020-01-05 NOTE — Progress Notes (Signed)
D/C instructions provided to patient, denies questions/concerns at this time. Patient transported to front entrance via WC, tol well. 

## 2020-01-05 NOTE — Progress Notes (Signed)
EEG Completed; Results Pending  

## 2020-01-06 NOTE — Progress Notes (Signed)
Appointment made for 01-09-20 at 4pm at [facility].  Patient has been notified.  Cleon Signorelli,CMA

## 2020-01-08 LAB — LEVETIRACETAM LEVEL: Levetiracetam Lvl: <1 ug/mL — ABNORMAL LOW (ref 10.0–40.0)

## 2020-01-09 ENCOUNTER — Ambulatory Visit (HOSPITAL_COMMUNITY): Admission: RE | Admit: 2020-01-09 | Payer: BC Managed Care – PPO | Source: Ambulatory Visit

## 2020-01-09 NOTE — Progress Notes (Signed)
Low keppra level in keeping with presumed etiology (nonadherence to medication) of known seizure disorder.  Shirlean Mylar, MD [facility], PGY-1

## 2020-01-12 ENCOUNTER — Ambulatory Visit: Payer: BC Managed Care – PPO | Admitting: Family Medicine

## 2020-01-12 NOTE — Progress Notes (Deleted)
    SUBJECTIVE:   CHIEF COMPLAINT / HPI:   ***  PERTINENT  PMH / PSH: ***  OBJECTIVE:   There were no vitals taken for this visit.  ***  ASSESSMENT/PLAN:   No problem-specific Assessment & Plan notes found for this encounter.     Towanda Octave, MD [facility]

## 2020-01-18 ENCOUNTER — Other Ambulatory Visit: Payer: Self-pay | Admitting: Family Medicine

## 2020-01-19 NOTE — Telephone Encounter (Signed)
If patient does not come to establish an appointment in the next 90 days, I cannot do another refill of the prescription. This was made clear at discharge.

## 2020-01-20 NOTE — Telephone Encounter (Signed)
Attempted to reach pt. No answer. No voice mail set up. Unable to leave message. Will try later today. Aquilla Solian, CMA

## 2020-01-20 NOTE — Telephone Encounter (Signed)
2nd attempt to reach pt. No answer. No voicemail set up. Aquilla Solian, CMA

## 2020-01-20 NOTE — Telephone Encounter (Signed)
3rd attempt to reach pt. No answer. No voicemail set up. Aquilla Solian, CMA

## 2020-11-05 DEATH — deceased

## 2021-09-06 IMAGING — DX DG CHEST 2V
2 series · 2 of 2 positions shown · non-contrast
Comparison: None.

CLINICAL DATA: Seizures while sleeping.

EXAM:
CHEST - 2 VIEW

[chest pa]
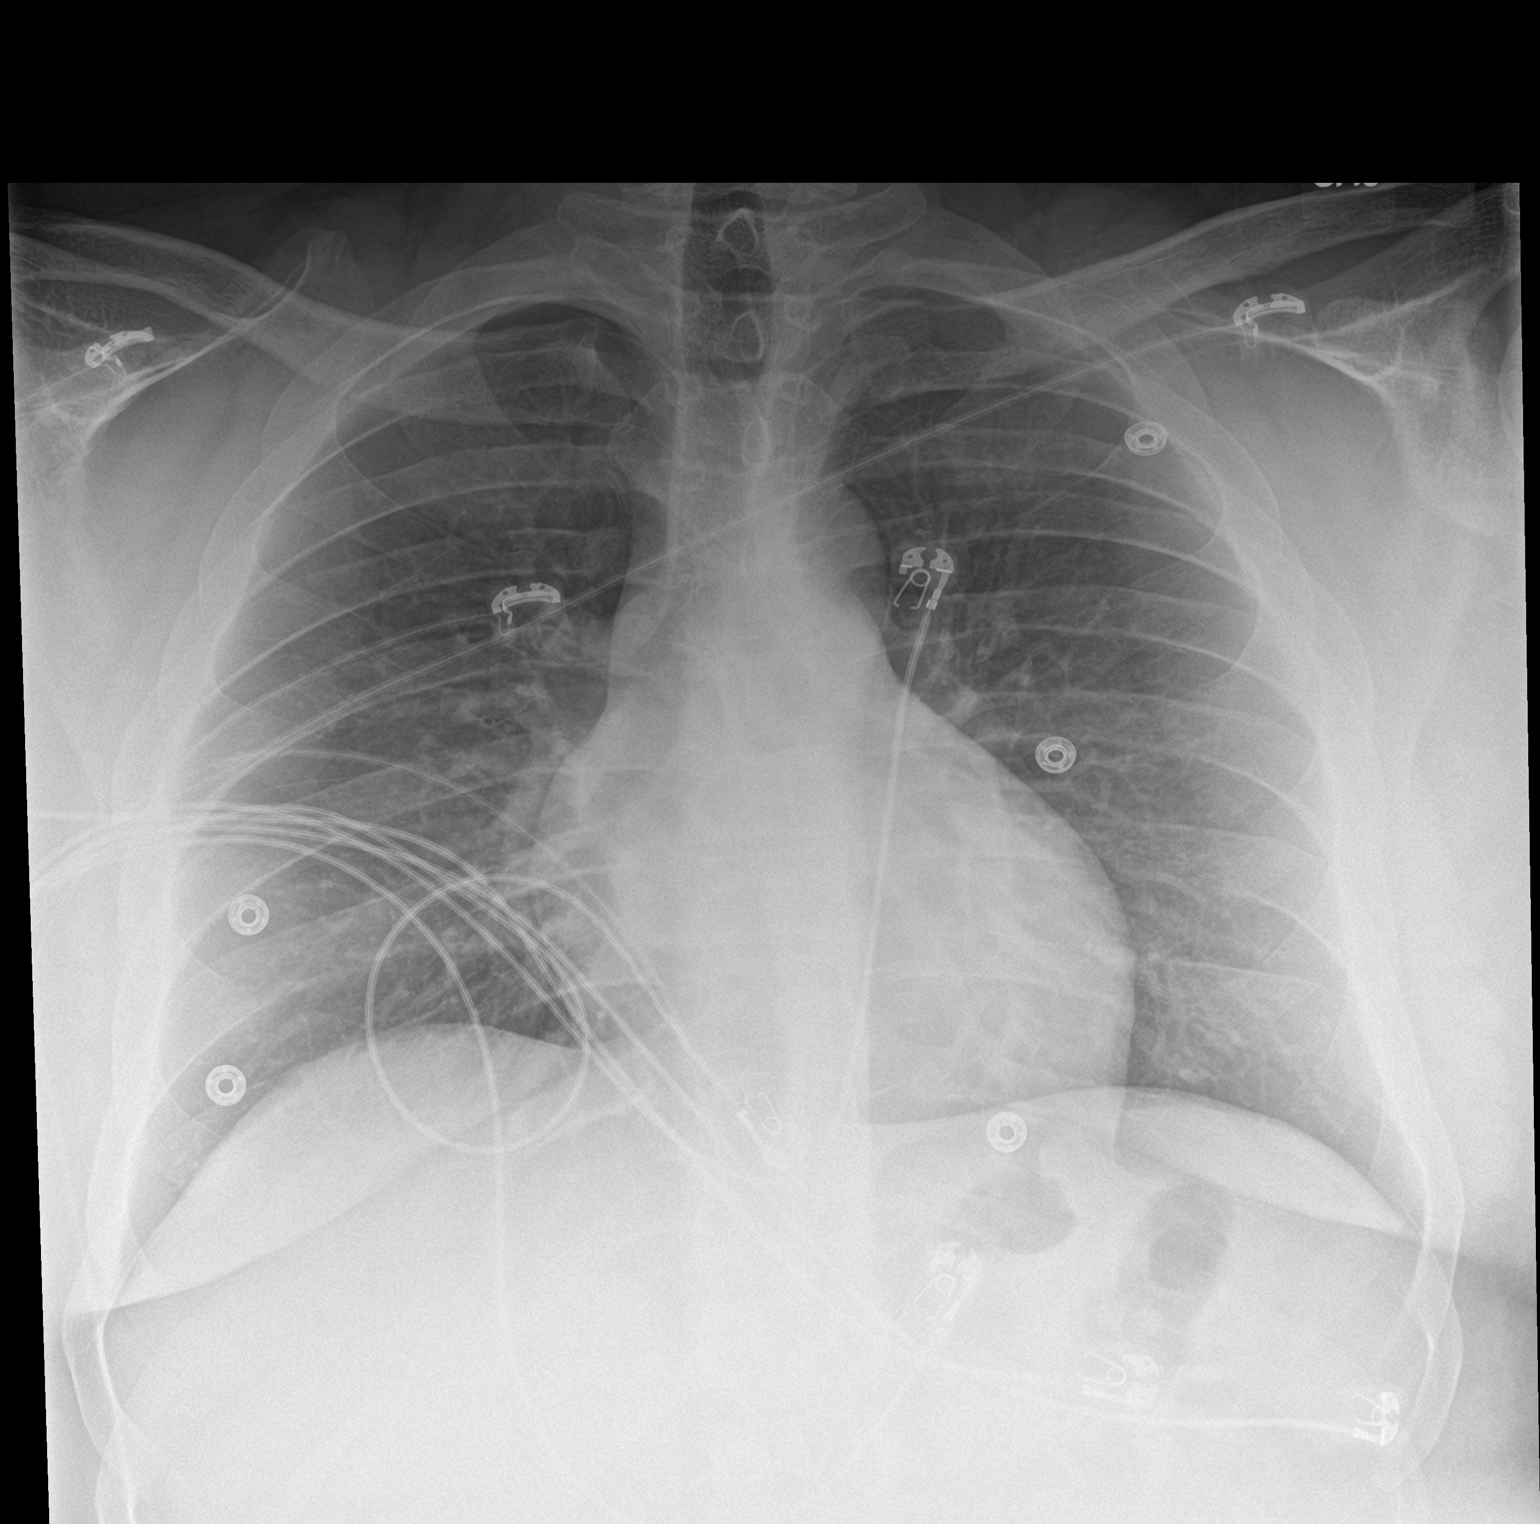

[chest lat]
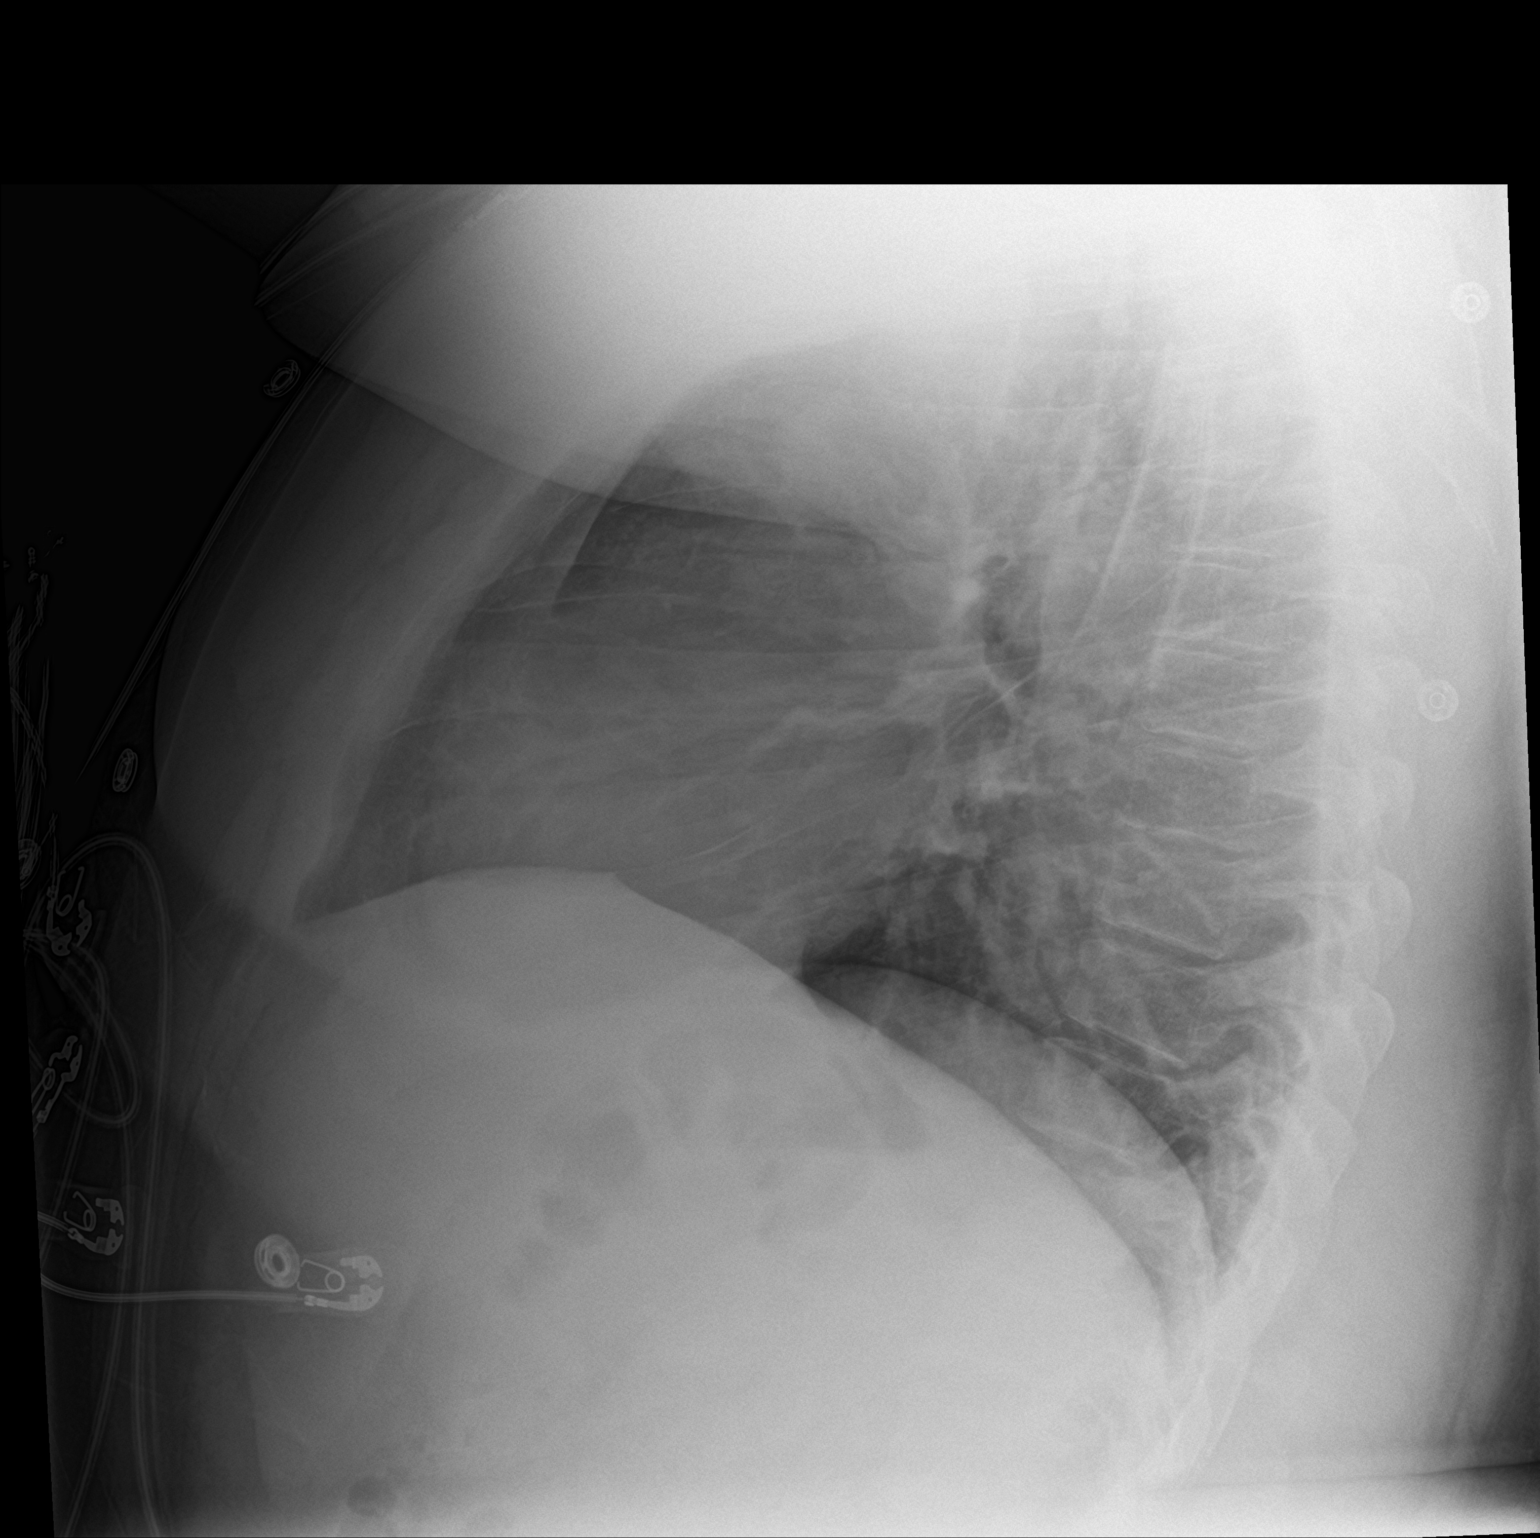

[2 of 2 positions shown; findings below may reference images not displayed]

FINDINGS: The heart size and mediastinal contours are within normal limits.
The lungs are clear. No pneumothorax or pleural effusion. The
visualized skeletal structures are unremarkable.
IMPRESSION: No acute cardiopulmonary process.
